# Patient Record
Sex: Female | Born: 1945 | Race: Black or African American
Health system: Southern US, Community
[De-identification: ages and names within clinical notes are randomized; demographics above are authoritative.]

---

## 2004-10-26 ENCOUNTER — Emergency Department: Payer: Self-pay | Admitting: Emergency Medicine

## 2004-10-26 ENCOUNTER — Other Ambulatory Visit: Payer: Self-pay

## 2007-01-17 IMAGING — CT CT CHEST W/ CM
2 series · 16 of 31 positions shown, 20 images · IV contrast (APPLIED)
Comparison: none

REASON FOR EXAM: chest pain  room #6
COMMENTS:

[Series 4: soft tissue · axial · 0.71mm/px · z∈[-115,-25]mm · 3 of 99 slices shown]
[im 8/99  mediastinal]
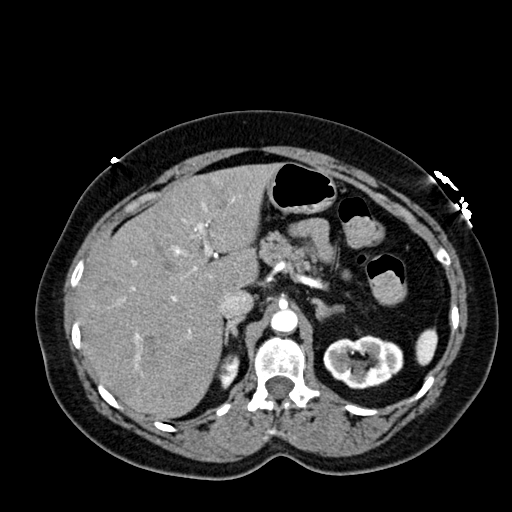
[im 23/99  mediastinal]
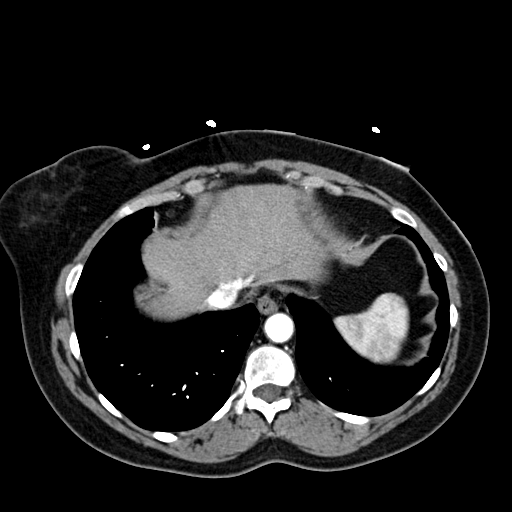
[im 38/99  mediastinal]
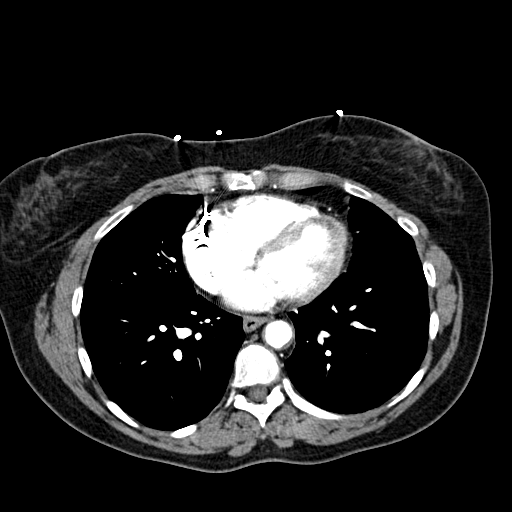

[Series 5: lung windows · axial · 0.71mm/px · z∈[-109,+134]mm · 13 of 97 slices shown, 17 images]
[im 8/97  mediastinal]
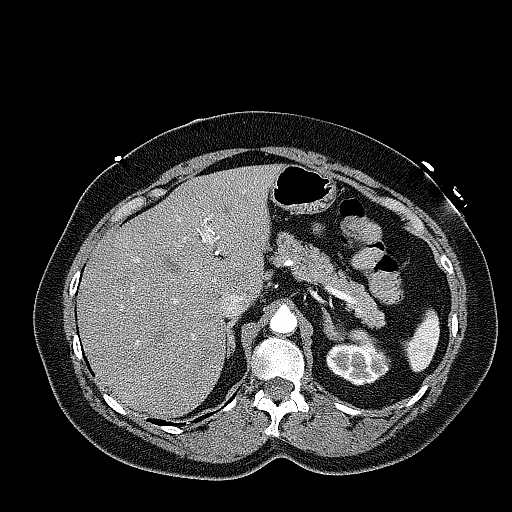
[im 8/97  lung]
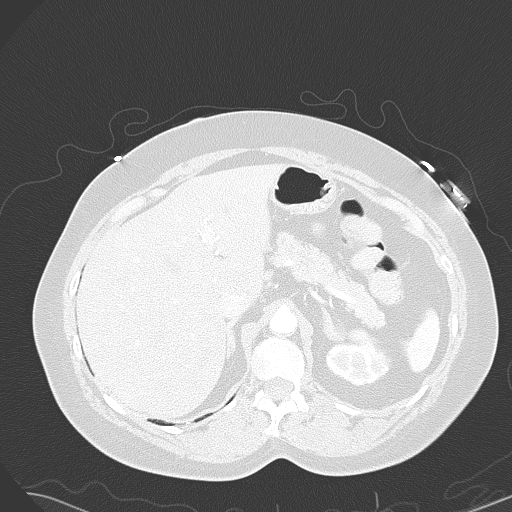
[im 15/97  lung]
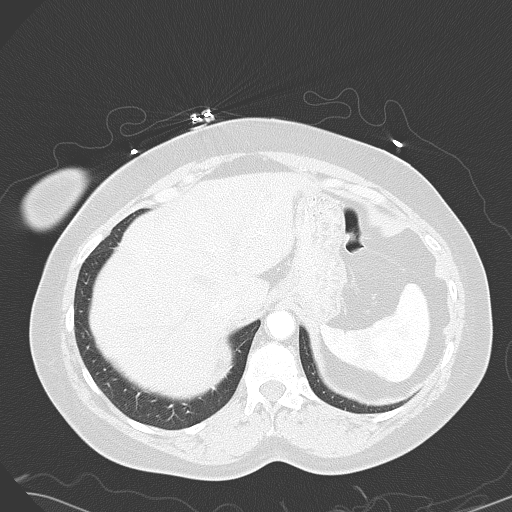
[im 23/97  lung]
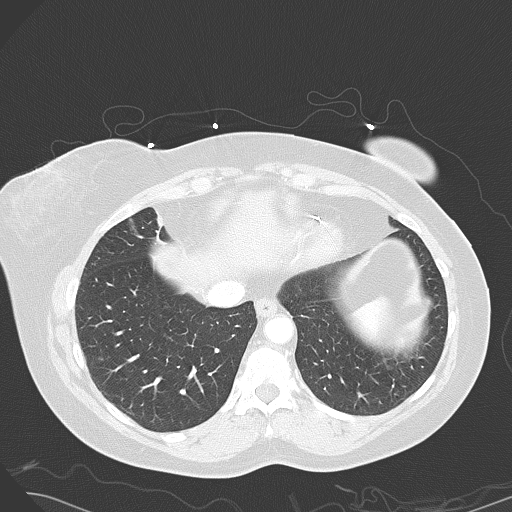
[im 30/97  lung]
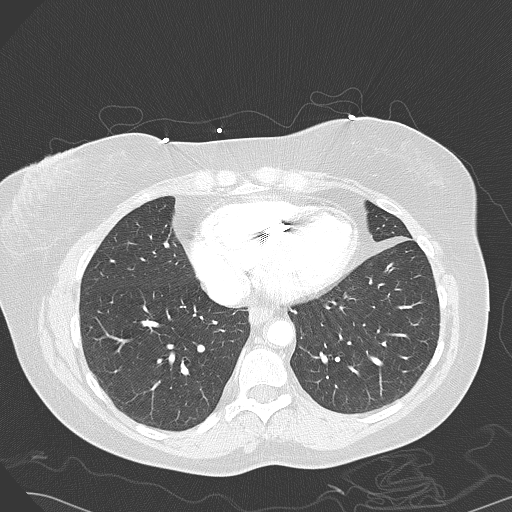
[im 37/97  mediastinal]
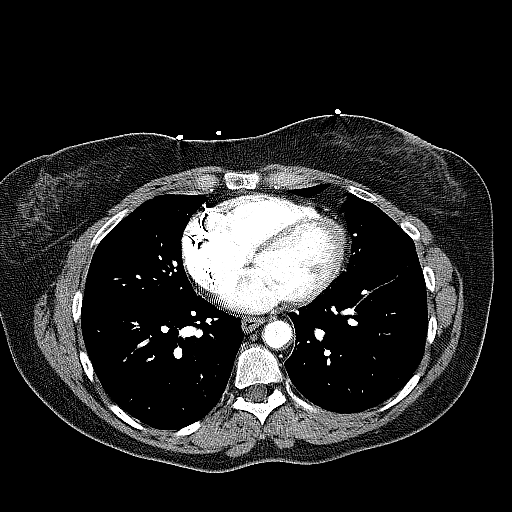
[im 37/97  lung]
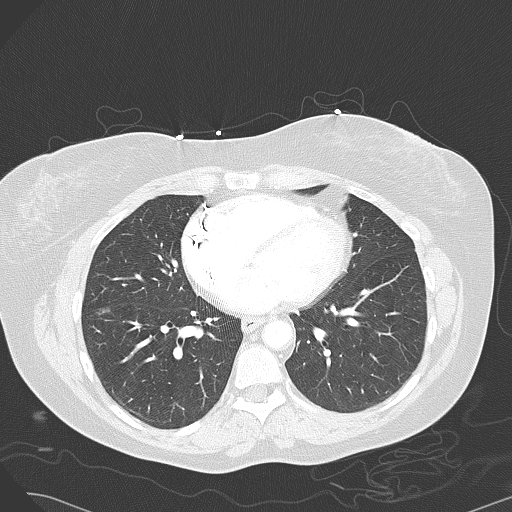
[im 45/97  lung]
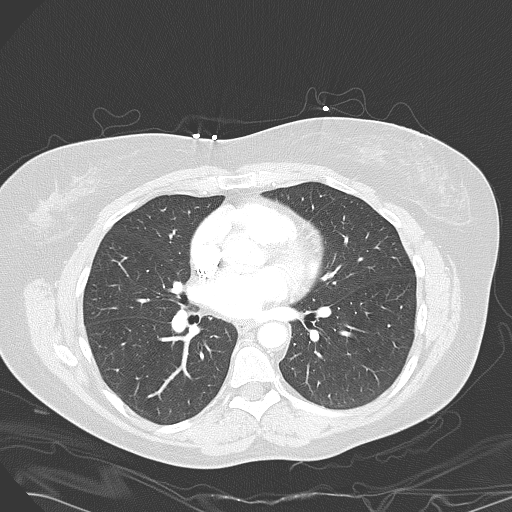
[im 49/97  lung]
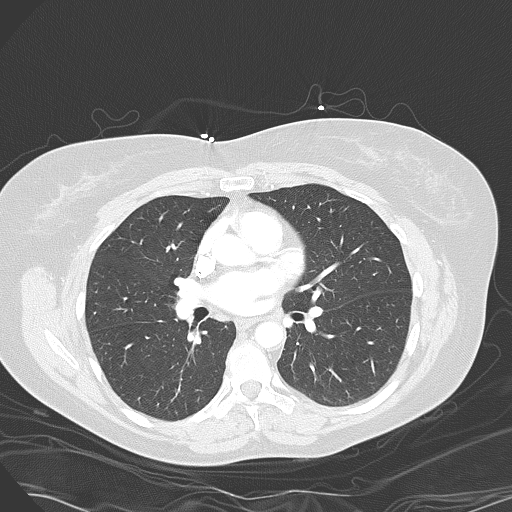
[im 52/97  lung]
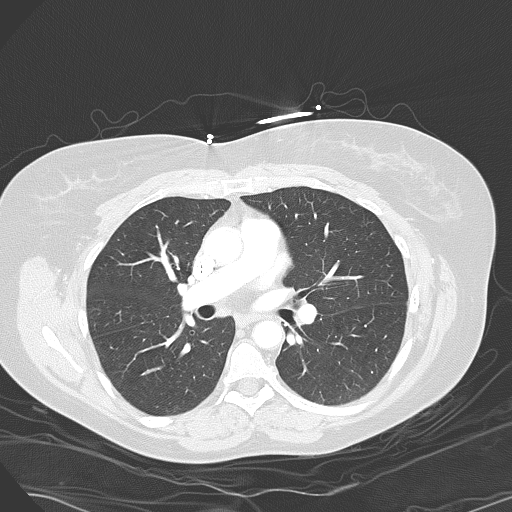
[im 60/97  mediastinal]
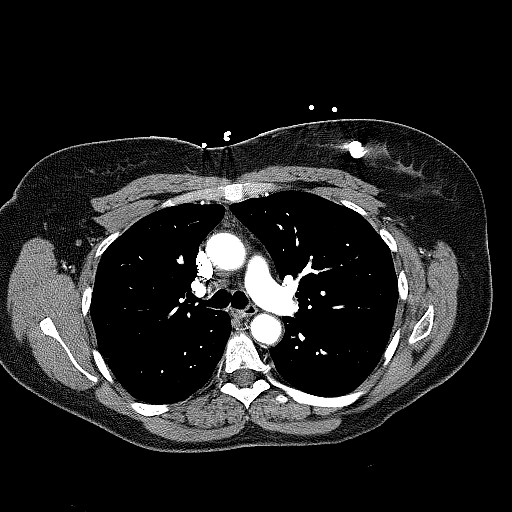
[im 60/97  lung]
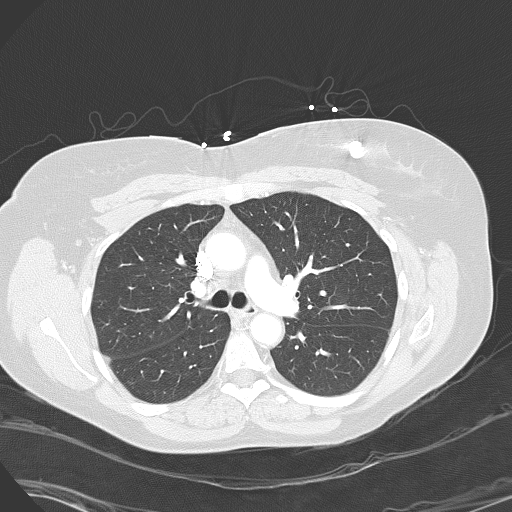
[im 67/97  lung]
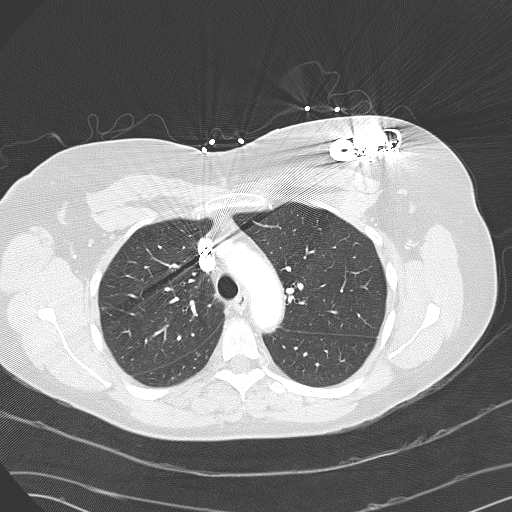
[im 74/97  lung]
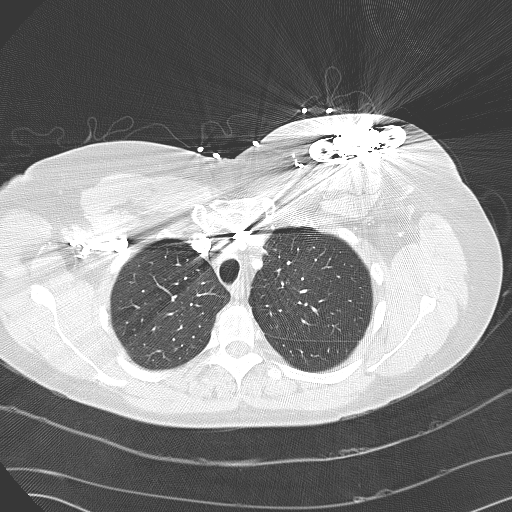
[im 82/97  lung]
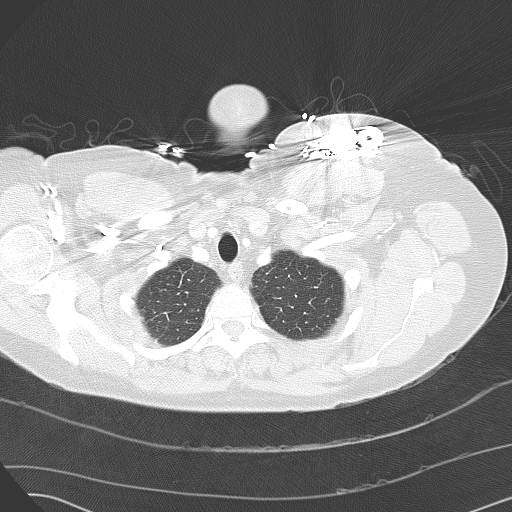
[im 89/97  mediastinal]
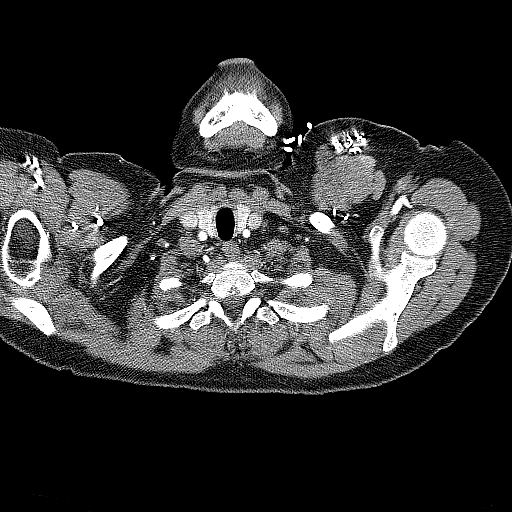
[im 89/97  lung]
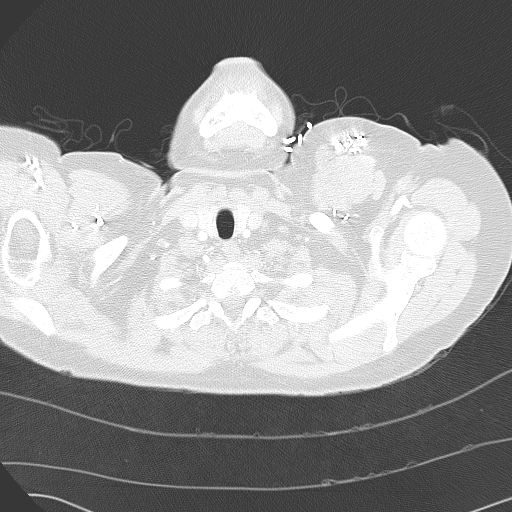

[16 of 31 positions shown; findings below may reference images not displayed]

PROCEDURE:     CT  - CT CHEST (FOR PE) W  - October 26, 2004  [DATE]

RESULT:        Multislice helical acquisition through the chest with
contrast enhancement shows good opacification of the pulmonary arteries with
no evidence of filling defect to suggest a diagnosis of pulmonary embolism.
There is no focal infiltrate.  There is no effusion.  There is no
pneumothorax.   Some minimal atelectasis or fibrosis in the lingula and
RIGHT middle lobe.  The upper abdominal viscera included on the study appear
to be grossly normal.  An implantable defibrillator is seen over the LEFT
chest.
IMPRESSION: No evidence of pulmonary embolus.

## 2007-01-17 IMAGING — CR DG CHEST 1V PORT
1 series · 1 of 1 positions shown · non-contrast
Comparison: none

REASON FOR EXAM: chest pain
COMMENTS:

[view not recorded]
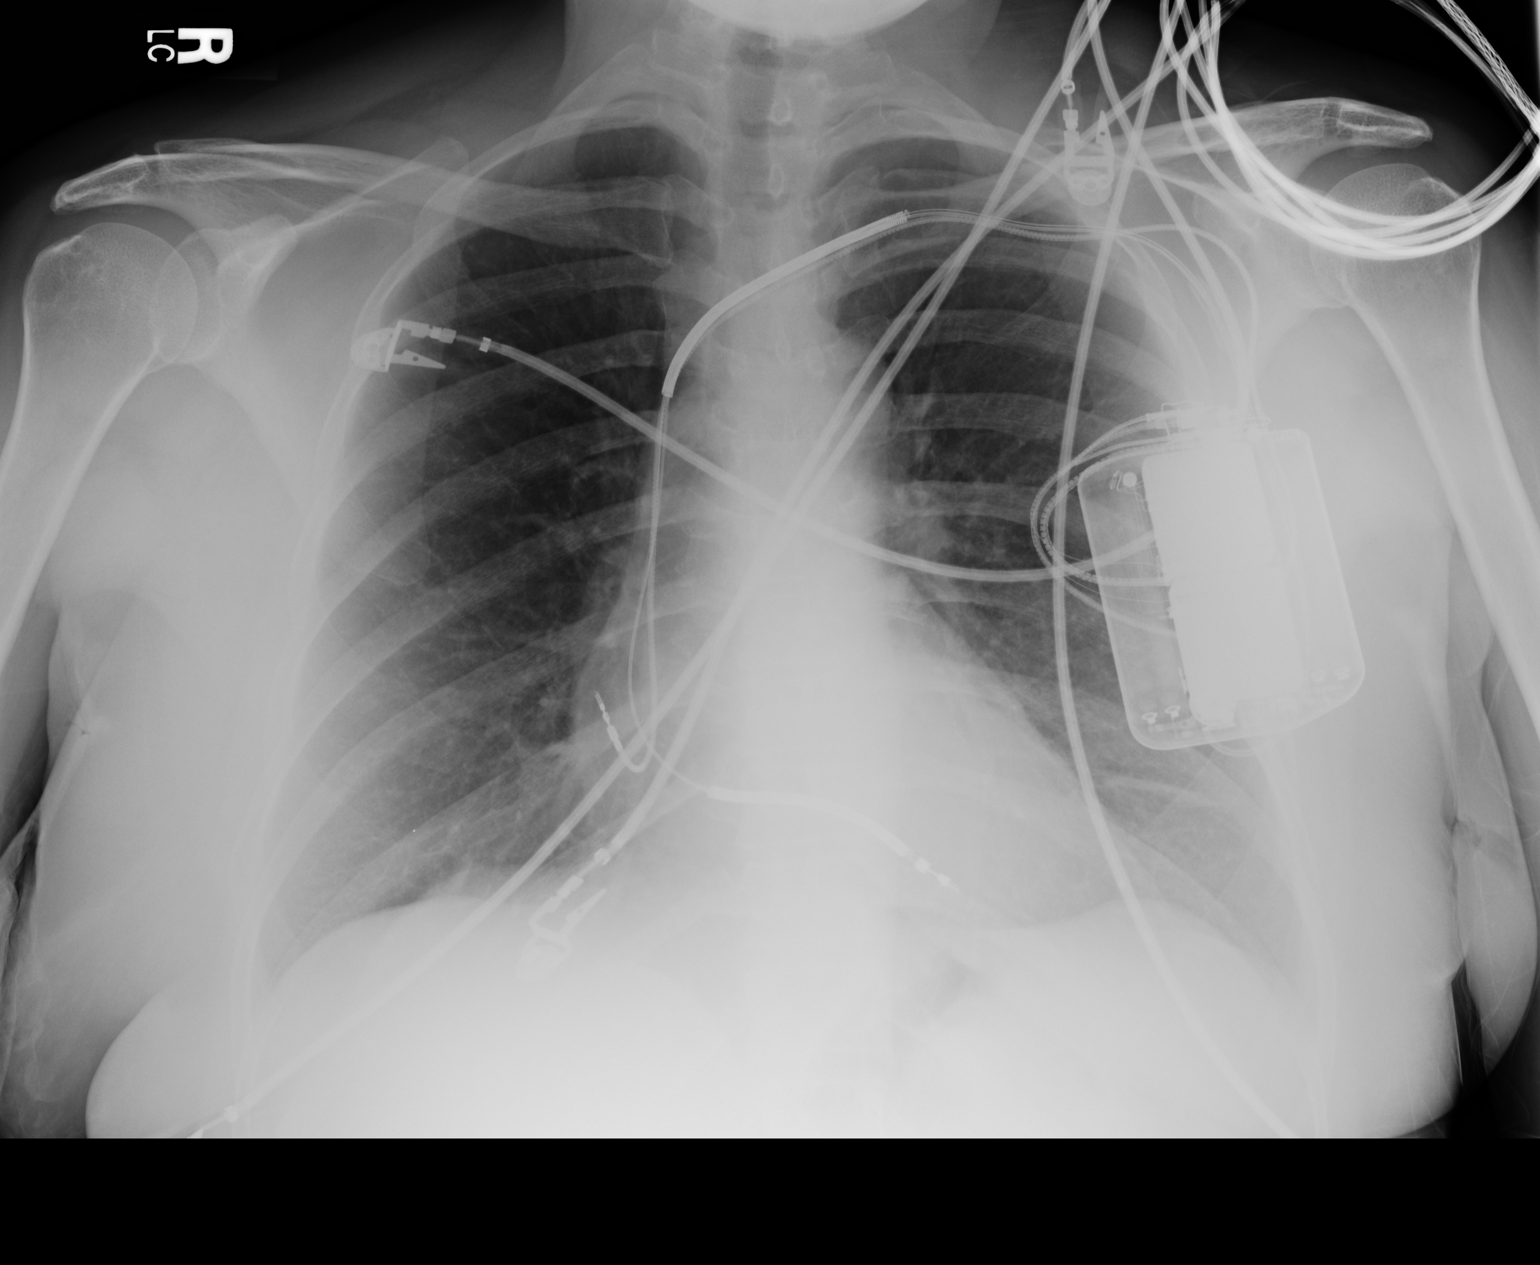

[1 of 1 positions shown; findings below may reference images not displayed]

PROCEDURE:     DXR - DXR PORTABLE CHEST SINGLE VIEW  - October 26, 2004  [DATE]

RESULT:       A single view of the chest demonstrates cardiac monitoring
electrodes are present.  There is an electronic device over the LEFT chest
which may represent an implantable defibrillator. The lungs appear to be
fully inflated and clear.   There may be some minimal band of atelectasis at
the LEFT lung base.
IMPRESSION: No acute cardiopulmonary disease evident.

## 2019-07-03 ENCOUNTER — Other Ambulatory Visit: Payer: Self-pay

## 2019-07-03 NOTE — Patient Outreach (Signed)
[facility]) Care Management  07/03/2019  [name] September 02, 1945 [id number]   Medication Adherence call to [name] [name] Telephone call to Patient regarding Medication Adherence unable to reach patient. [name] did not answer patient is past due on Atorvastatin 40 mg under [employer].   [name] CPhT Pharmacy Technician [facility] Management Direct Dial [phone]  Fax [phone] [email or web address]
